# Patient Record
Sex: Female | Born: 1975 | Hispanic: Yes | Marital: Single
Health system: Southern US, Community
[De-identification: ages and names within clinical notes are randomized; demographics above are authoritative.]

---

## 2020-06-08 ENCOUNTER — Emergency Department
Admission: EM | Admit: 2020-06-08 | Discharge: 2020-06-08 | Disposition: A | Discharge status: Home / Self Care | Payer: HRSA Program | Attending: Emergency Medicine | Admitting: Emergency Medicine

## 2020-06-08 ENCOUNTER — Emergency Department: Payer: HRSA Program

## 2020-06-08 ENCOUNTER — Encounter: Payer: Self-pay | Admitting: Emergency Medicine

## 2020-06-08 ENCOUNTER — Other Ambulatory Visit: Payer: Self-pay

## 2020-06-08 DIAGNOSIS — R05 Cough: Secondary | ICD-10-CM | POA: Diagnosis present

## 2020-06-08 DIAGNOSIS — U071 COVID-19: Secondary | ICD-10-CM | POA: Diagnosis not present

## 2020-06-08 DIAGNOSIS — M545 Low back pain: Secondary | ICD-10-CM | POA: Insufficient documentation

## 2020-06-08 DIAGNOSIS — R509 Fever, unspecified: Secondary | ICD-10-CM | POA: Insufficient documentation

## 2020-06-08 DIAGNOSIS — Z1152 Encounter for screening for COVID-19: Secondary | ICD-10-CM | POA: Diagnosis not present

## 2020-06-08 DIAGNOSIS — J189 Pneumonia, unspecified organism: Secondary | ICD-10-CM | POA: Insufficient documentation

## 2020-06-08 LAB — CBC WITH DIFFERENTIAL/PLATELET
Abs Immature Granulocytes: 0.02 10*3/uL (ref 0.00–0.07)
Basophils Absolute: 0 10*3/uL (ref 0.0–0.1)
Basophils Relative: 0 %
Eosinophils Absolute: 0 10*3/uL (ref 0.0–0.5)
Eosinophils Relative: 0 %
HCT: 39.7 % (ref 36.0–46.0)
Hemoglobin: 13.3 g/dL (ref 12.0–15.0)
Immature Granulocytes: 0 %
Lymphocytes Relative: 13 %
Lymphs Abs: 0.9 10*3/uL (ref 0.7–4.0)
MCH: 30.6 pg (ref 26.0–34.0)
MCHC: 33.5 g/dL (ref 30.0–36.0)
MCV: 91.5 fL (ref 80.0–100.0)
Monocytes Absolute: 0.4 10*3/uL (ref 0.1–1.0)
Monocytes Relative: 5 %
Neutro Abs: 5.6 10*3/uL (ref 1.7–7.7)
Neutrophils Relative %: 82 %
Platelets: 167 10*3/uL (ref 150–400)
RBC: 4.34 MIL/uL (ref 3.87–5.11)
RDW: 12 % (ref 11.5–15.5)
WBC: 6.9 10*3/uL (ref 4.0–10.5)
nRBC: 0 % (ref 0.0–0.2)

## 2020-06-08 LAB — COMPREHENSIVE METABOLIC PANEL
ALT: 28 U/L (ref 0–44)
AST: 43 U/L — ABNORMAL HIGH (ref 15–41)
Albumin: 4.2 g/dL (ref 3.5–5.0)
Alkaline Phosphatase: 77 U/L (ref 38–126)
Anion gap: 9 (ref 5–15)
BUN: 9 mg/dL (ref 6–20)
CO2: 25 mmol/L (ref 22–32)
Calcium: 8.7 mg/dL — ABNORMAL LOW (ref 8.9–10.3)
Chloride: 103 mmol/L (ref 98–111)
Creatinine, Ser: 0.9 mg/dL (ref 0.44–1.00)
GFR calc Af Amer: >60 mL/min (ref >60)
GFR calc non Af Amer: >60 mL/min (ref >60)
Glucose, Bld: 108 mg/dL — ABNORMAL HIGH (ref 70–99)
Potassium: 3.7 mmol/L (ref 3.5–5.1)
Sodium: 137 mmol/L (ref 135–145)
Total Bilirubin: 0.5 mg/dL (ref 0.3–1.2)
Total Protein: 7.8 g/dL (ref 6.5–8.1)

## 2020-06-08 LAB — URINALYSIS, COMPLETE (UACMP) WITH MICROSCOPIC
Bacteria, UA: NONE SEEN
Bilirubin Urine: NEGATIVE
Glucose, UA: NEGATIVE mg/dL
Hgb urine dipstick: NEGATIVE
Ketones, ur: 5 mg/dL — AB
Leukocytes,Ua: NEGATIVE
Nitrite: NEGATIVE
Protein, ur: 30 mg/dL — AB
Specific Gravity, Urine: 1.028 (ref 1.005–1.030)
pH: 5 (ref 5.0–8.0)

## 2020-06-08 LAB — POCT PREGNANCY, URINE: Preg Test, Ur: NEGATIVE

## 2020-06-08 LAB — SARS CORONAVIRUS 2 BY RT PCR (HOSPITAL ORDER, PERFORMED IN ~~LOC~~ HOSPITAL LAB): SARS Coronavirus 2: POSITIVE — AB

## 2020-06-08 LAB — LACTIC ACID, PLASMA: Lactic Acid, Venous: 1.6 mmol/L (ref 0.5–1.9)

## 2020-06-08 LAB — TROPONIN I (HIGH SENSITIVITY): Troponin I (High Sensitivity): 5 ng/L (ref <18)

## 2020-06-08 MED ORDER — SODIUM CHLORIDE 0.9 % IV BOLUS
1000.0000 mL | Freq: Once | INTRAVENOUS | Status: AC
  Administered 2020-06-08: 1000 mL via INTRAVENOUS

## 2020-06-08 MED ORDER — SODIUM CHLORIDE 0.9 % IV SOLN
1.0000 g | Freq: Once | INTRAVENOUS | Status: AC
  Administered 2020-06-08: 1 g via INTRAVENOUS
  Filled 2020-06-08: qty 10

## 2020-06-08 MED ORDER — DOXYCYCLINE HYCLATE 100 MG PO CAPS
100.0000 mg | ORAL_CAPSULE | Freq: Two times a day (BID) | ORAL | 0 refills | Status: AC
Start: 2020-06-08 — End: ?

## 2020-06-08 NOTE — Discharge Instructions (Signed)
Follow-up with your primary care provider for reevaluation of your pneumonia.  Also watch the MyChart to see if your Covid test is positive if so the antibiotics that were prescribed and sent to [facility] will not help with this type of pneumonia.  Return to the emergency department immediately if any shortness of breath or difficulty breathing.  Tylenol if needed for fever.  Quarantine for 10 days if your Covid test is positive.

## 2020-06-08 NOTE — ED Triage Notes (Signed)
Per interpreter, pt reports productive cough with pain in her chest when she coughs and pain in her back. Pt also reports fevers. States sx's for 3 days.

## 2020-06-08 NOTE — ED Notes (Signed)
Patient transported to X-ray 

## 2020-06-08 NOTE — ED Provider Notes (Signed)
[facility] Emergency Department Provider Note  ____________________________________________   First MD Initiated Contact with Patient 06/08/20 1222     (approximate)  I have reviewed the triage vital signs and the nursing notes.   HISTORY  Chief Complaint Cough, Back Pain, and Fever Spanish interpreter  HPI Katie Costa is a 44 y.o. female presents to the ED with complaint of productive cough and chest pain when she coughs for the last 3 days.  Patient reports subjective fever at home.  There is been no nausea, vomiting, diarrhea and she denies change in taste or smell.  Patient denies being around any sick family members or known Covid exposures.  Patient is a non-smoker.  Patient has not been tested for Covid prior to today and denies taking the Covid vaccine.  Patient denies any prior cardiac history.       History reviewed. No pertinent past medical history.  There are no problems to display for this patient.   History reviewed. No pertinent surgical history.  Prior to Admission medications   Medication Sig Start Date End Date Taking? Authorizing Provider  doxycycline (VIBRAMYCIN) 100 MG capsule Take 1 capsule (100 mg total) by mouth 2 (two) times daily. 06/08/20   Tommi Rumps, PA-C    Allergies Patient has no allergy information on record.  History reviewed. No pertinent family history.  Social History Social History   Tobacco Use  . Smoking status: Not on file  Substance Use Topics  . Alcohol use: Not on file  . Drug use: Not on file    Review of Systems Constitutional: Subjective fever/chills Eyes: No visual changes. ENT: No sore throat. Cardiovascular: Denies chest pain. Respiratory: Denies shortness of breath.  Positive productive cough. Gastrointestinal: No abdominal pain.  No nausea, no vomiting.  No diarrhea.   Genitourinary: Negative for dysuria. Musculoskeletal: Positive for muscle aches. Skin:  Negative for rash. Neurological: Negative for headaches, focal weakness or numbness. ____________________________________________   PHYSICAL EXAM:  VITAL SIGNS: ED Triage Vitals  Enc Vitals Group     BP 06/08/20 1154 120/79     Pulse Rate 06/08/20 1154 96     Resp 06/08/20 1154 20     Temp 06/08/20 1154 99.8 F (37.7 C)     Temp Source 06/08/20 1154 Oral     SpO2 06/08/20 1154 99 %     Weight 06/08/20 1155 160 lb (72.6 kg)     Height 06/08/20 1155 5\' 4"  (1.626 m)     Head Circumference --      Peak Flow --      Pain Score 06/08/20 1154 8     Pain Loc --      Pain Edu? --      Excl. in GC? --    Constitutional: Alert and oriented. Well appearing and in no acute distress. Eyes: Conjunctivae are normal. PERRL. EOMI. Head: Atraumatic. Neck: No stridor.   Cardiovascular: Normal rate, regular rhythm. Grossly normal heart sounds.  Good peripheral circulation. Respiratory: Normal respiratory effort.  No retractions. Lungs CTAB.  No cough noted during exam. Gastrointestinal: Soft and nontender. No distention.  Musculoskeletal: Moves upper and lower extremities that any difficulty and patient is able to ambulate without any assistance.  There is no edema noted to the lower extremities. Neurologic:  Normal speech and language. No gross focal neurologic deficits are appreciated. No gait instability. Skin:  Skin is warm, dry and intact. No rash noted. Psychiatric: Mood and affect are normal. Speech  and behavior are normal.  ____________________________________________   LABS (all labs ordered are listed, but only abnormal results are displayed)  Labs Reviewed  URINALYSIS, COMPLETE (UACMP) WITH MICROSCOPIC - Abnormal; Notable for the following components:      Result Value   Color, Urine AMBER (*)    APPearance HAZY (*)    Ketones, ur 5 (*)    Protein, ur 30 (*)    All other components within normal limits  COMPREHENSIVE METABOLIC PANEL - Abnormal; Notable for the following  components:   Glucose, Bld 108 (*)    Calcium 8.7 (*)    AST 43 (*)    All other components within normal limits  SARS CORONAVIRUS 2 BY RT PCR (HOSPITAL ORDER, PERFORMED IN Between [facility]OSPITAL [facility])  CBC WITH DIFFERENTIAL/PLATELET  LACTIC ACID, PLASMA  POC URINE PREG, ED  POCT PREGNANCY, URINE  TROPONIN I (HIGH SENSITIVITY)   ____________________________________________  EKG Normal sinus rhythm with ventricular rate of 82 EKG was viewed by physician in the main ED.  ____________________________________________  RADIOLOGY   Official radiology report(s): DG Chest 2 View  Result Date: 06/08/2020 CLINICAL DATA:  44 year old female with history of productive cough and chest pain with fever for the past 3 days. Shortness of breath. EXAM: CHEST - 2 VIEW COMPARISON:  No priors. FINDINGS: Patchy ill-defined opacities and areas of interstitial prominence are noted throughout the mid to lower lungs bilaterally. Lung volumes are normal. No pleural effusions. No pneumothorax. No pulmonary nodule or mass noted. Pulmonary vasculature and the cardiomediastinal silhouette are within normal limits. IMPRESSION: 1. The appearance the chest is concerning for multilobar bilateral pneumonia. Clinical correlation for signs and symptoms of viral infection is recommended. Electronically Signed   By: Trudie Reed M.D.   On: 06/08/2020 12:49    ____________________________________________   PROCEDURES  Procedure(s) performed (including Critical Care):  Procedures   ____________________________________________   INITIAL IMPRESSION / ASSESSMENT AND PLAN / ED COURSE  As part of my medical decision making, I reviewed the following data within the electronic MEDICAL RECORD NUMBER Notes from prior ED visits and [place] Controlled Substance Database  44 year old female presents to the ED with complaint of feeling sick with productive cough and fever for the last 3 days.  She denies any known exposure to Covid  but has not had the Covid vaccine.  Patient was reassured when troponin, CBC and CMP were normal.  EKG showed normal sinus rhythm with ventricular rate of 82.  Chest x-ray however showed multilobar bilateral pneumonia.  Patient vital signs remained stable with an O2 sat of 99%.  Patient had low-grade temp of 99.8.  Spanish interpreter was called to explain to her her results and how to look on my chart for the results of her Covid test.  Prior to discharge patient was given 1 g Rocephin and a prescription for doxycycline if this is not Covid related. ____________________________________________   FINAL CLINICAL IMPRESSION(S) / ED DIAGNOSES  Final diagnoses:  Community acquired pneumonia, bilateral  Encounter for screening for COVID-19     ED Discharge Orders         Ordered    doxycycline (VIBRAMYCIN) 100 MG capsule  2 times daily     Discontinue  Reprint     06/08/20 1617           Note:  This document was prepared using Dragon voice recognition software and may include unintentional dictation errors.    Tommi Rumps, PA-C 06/08/20 1646    Kinner,  Molly Maduro, MD 06/10/20 [phone]

## 2021-09-08 IMAGING — CR DG CHEST 2V
2 series · 2 of 2 positions shown · non-contrast
Comparison: No priors.

CLINICAL DATA: 44-year-old female with history of productive cough
and chest pain with fever for the past 3 days. Shortness of breath.

EXAM:
CHEST - 2 VIEW

[chest pa]
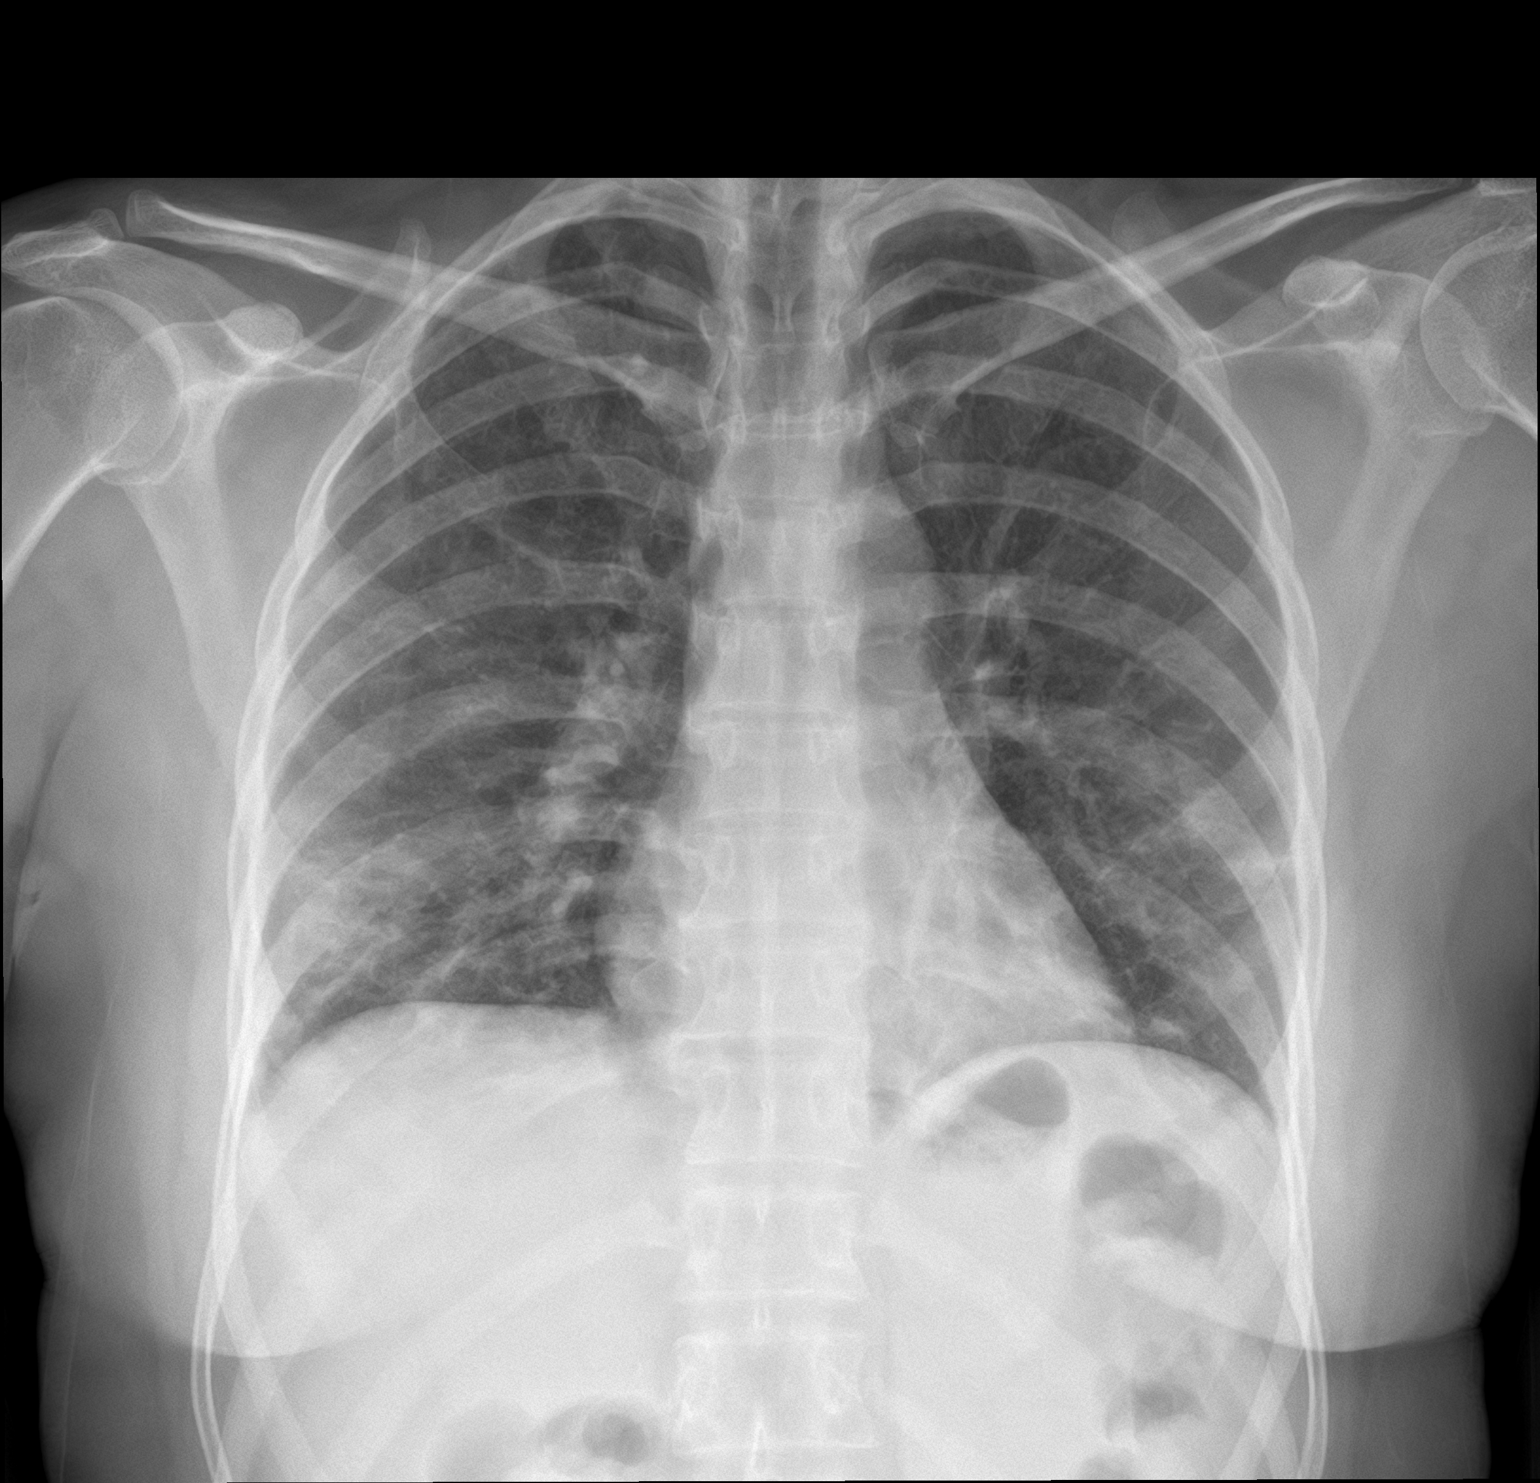

[chest lat]
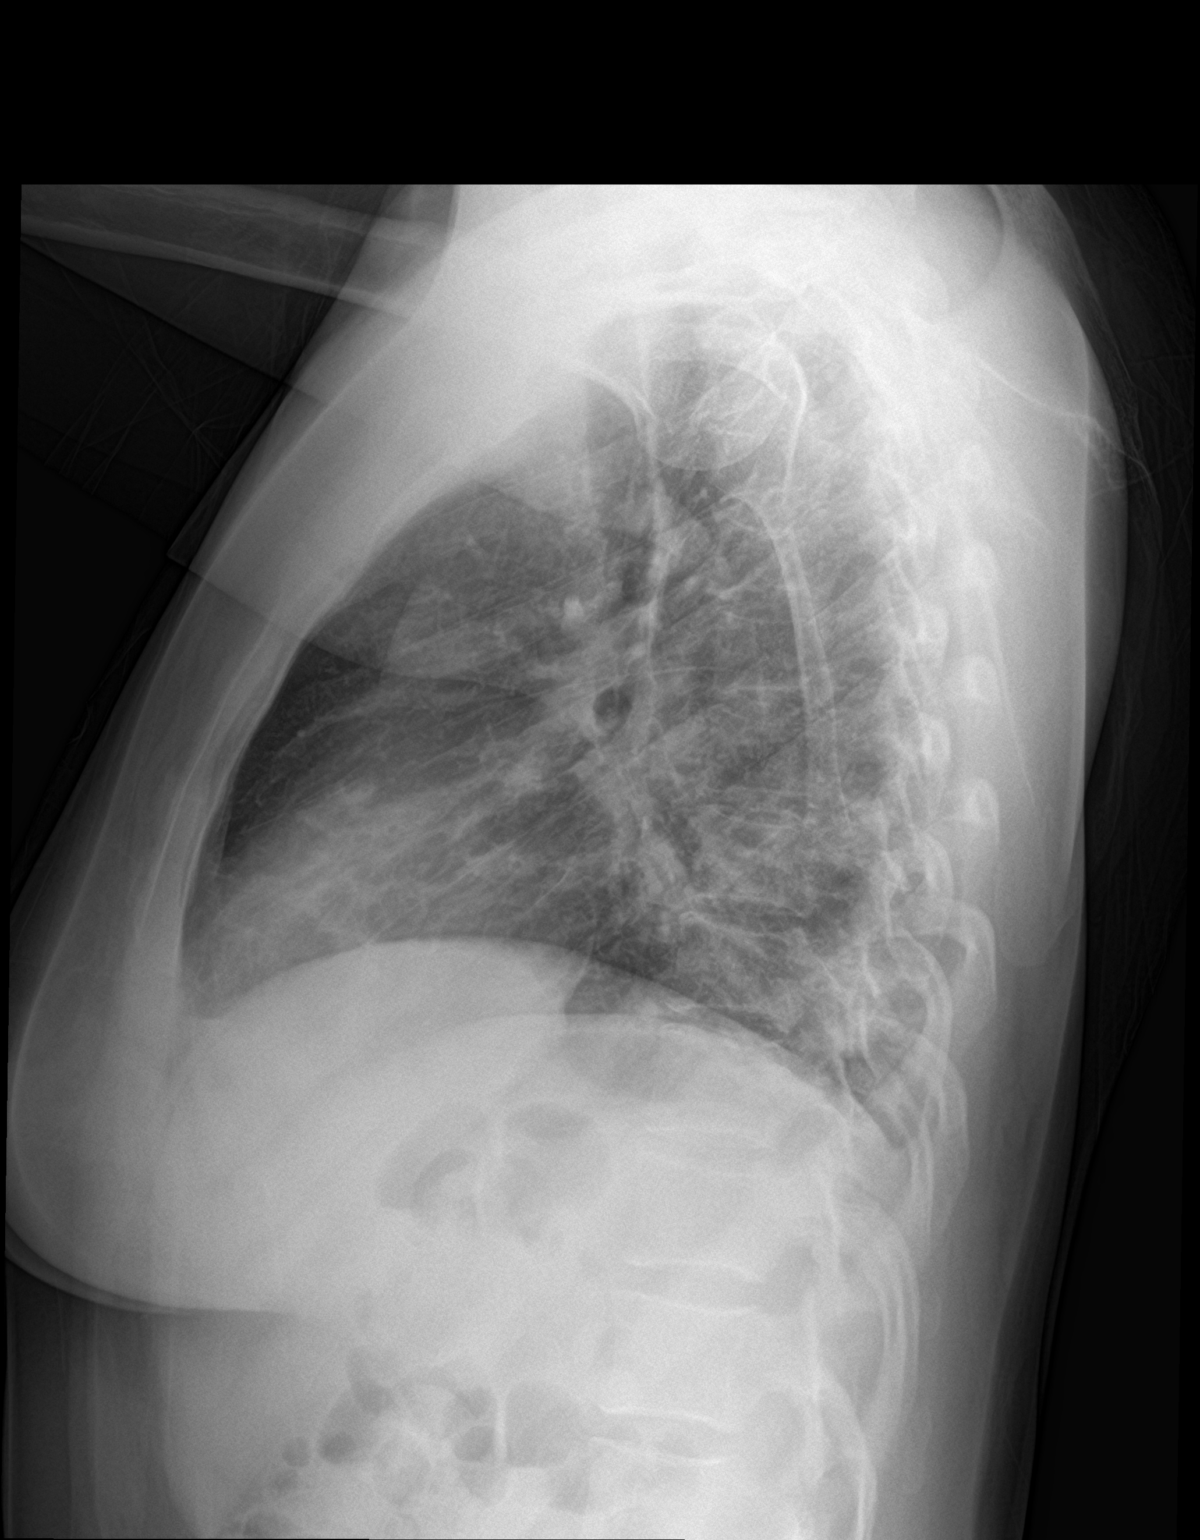

[2 of 2 positions shown; findings below may reference images not displayed]

FINDINGS: Patchy ill-defined opacities and areas of interstitial prominence
are noted throughout the mid to lower lungs bilaterally. Lung
volumes are normal. No pleural effusions. No pneumothorax. No
pulmonary nodule or mass noted. Pulmonary vasculature and the
cardiomediastinal silhouette are within normal limits.
IMPRESSION: 1. The appearance the chest is concerning for multilobar bilateral
pneumonia. Clinical correlation for signs and symptoms of viral
infection is recommended.
